# Patient Record
Sex: Male | Born: 1975 | Race: White | Hispanic: No | Marital: Married | Smoking: Never smoker
Health system: Southern US, Community
[De-identification: ages and names within clinical notes are randomized; demographics above are authoritative.]

## PROBLEM LIST (undated history)

## (undated) DIAGNOSIS — I10 Essential (primary) hypertension: Secondary | ICD-10-CM

---

## 2018-05-20 ENCOUNTER — Other Ambulatory Visit: Payer: Self-pay

## 2018-05-20 ENCOUNTER — Emergency Department (HOSPITAL_BASED_OUTPATIENT_CLINIC_OR_DEPARTMENT_OTHER): Payer: Self-pay

## 2018-05-20 ENCOUNTER — Inpatient Hospital Stay (HOSPITAL_BASED_OUTPATIENT_CLINIC_OR_DEPARTMENT_OTHER)
Admission: EM | Admit: 2018-05-20 | Discharge: 2018-05-23 | DRG: 917 | Disposition: A | Discharge status: Home / Self Care | Payer: Self-pay | Attending: Internal Medicine | Admitting: Internal Medicine

## 2018-05-20 ENCOUNTER — Encounter (HOSPITAL_BASED_OUTPATIENT_CLINIC_OR_DEPARTMENT_OTHER): Payer: Self-pay | Admitting: Emergency Medicine

## 2018-05-20 DIAGNOSIS — J705 Respiratory conditions due to smoke inhalation: Secondary | ICD-10-CM | POA: Diagnosis present

## 2018-05-20 DIAGNOSIS — R0603 Acute respiratory distress: Secondary | ICD-10-CM

## 2018-05-20 DIAGNOSIS — F419 Anxiety disorder, unspecified: Secondary | ICD-10-CM | POA: Diagnosis present

## 2018-05-20 DIAGNOSIS — Y9289 Other specified places as the place of occurrence of the external cause: Secondary | ICD-10-CM

## 2018-05-20 DIAGNOSIS — Z91018 Allergy to other foods: Secondary | ICD-10-CM

## 2018-05-20 DIAGNOSIS — R7309 Other abnormal glucose: Secondary | ICD-10-CM | POA: Diagnosis present

## 2018-05-20 DIAGNOSIS — T59811A Toxic effect of smoke, accidental (unintentional), initial encounter: Principal | ICD-10-CM | POA: Diagnosis present

## 2018-05-20 DIAGNOSIS — D72829 Elevated white blood cell count, unspecified: Secondary | ICD-10-CM | POA: Diagnosis present

## 2018-05-20 DIAGNOSIS — J9601 Acute respiratory failure with hypoxia: Secondary | ICD-10-CM | POA: Diagnosis present

## 2018-05-20 DIAGNOSIS — Z91010 Allergy to peanuts: Secondary | ICD-10-CM

## 2018-05-20 DIAGNOSIS — J40 Bronchitis, not specified as acute or chronic: Secondary | ICD-10-CM

## 2018-05-20 DIAGNOSIS — N179 Acute kidney failure, unspecified: Secondary | ICD-10-CM | POA: Diagnosis present

## 2018-05-20 DIAGNOSIS — T508X5A Adverse effect of diagnostic agents, initial encounter: Secondary | ICD-10-CM | POA: Diagnosis not present

## 2018-05-20 DIAGNOSIS — I10 Essential (primary) hypertension: Secondary | ICD-10-CM | POA: Diagnosis present

## 2018-05-20 DIAGNOSIS — Z9114 Patient's other noncompliance with medication regimen: Secondary | ICD-10-CM

## 2018-05-20 DIAGNOSIS — Y92230 Patient room in hospital as the place of occurrence of the external cause: Secondary | ICD-10-CM | POA: Diagnosis not present

## 2018-05-20 DIAGNOSIS — Z880 Allergy status to penicillin: Secondary | ICD-10-CM

## 2018-05-20 DIAGNOSIS — F17220 Nicotine dependence, chewing tobacco, uncomplicated: Secondary | ICD-10-CM | POA: Diagnosis present

## 2018-05-20 DIAGNOSIS — Z8249 Family history of ischemic heart disease and other diseases of the circulatory system: Secondary | ICD-10-CM

## 2018-05-20 DIAGNOSIS — Z833 Family history of diabetes mellitus: Secondary | ICD-10-CM

## 2018-05-20 DIAGNOSIS — I4892 Unspecified atrial flutter: Secondary | ICD-10-CM | POA: Diagnosis present

## 2018-05-20 DIAGNOSIS — J209 Acute bronchitis, unspecified: Secondary | ICD-10-CM | POA: Diagnosis present

## 2018-05-20 DIAGNOSIS — K0889 Other specified disorders of teeth and supporting structures: Secondary | ICD-10-CM | POA: Diagnosis present

## 2018-05-20 DIAGNOSIS — I471 Supraventricular tachycardia: Secondary | ICD-10-CM | POA: Diagnosis present

## 2018-05-20 HISTORY — DX: Essential (primary) hypertension: I10

## 2018-05-20 LAB — CBC WITH DIFFERENTIAL/PLATELET
Abs Immature Granulocytes: 0.04 10*3/uL (ref 0.00–0.07)
Basophils Absolute: 0.1 10*3/uL (ref 0.0–0.1)
Basophils Relative: 1 %
Eosinophils Absolute: 0.1 10*3/uL (ref 0.0–0.5)
Eosinophils Relative: 0 %
HCT: 52.4 % — ABNORMAL HIGH (ref 39.0–52.0)
Hemoglobin: 17.5 g/dL — ABNORMAL HIGH (ref 13.0–17.0)
Immature Granulocytes: 0 %
Lymphocytes Relative: 4 %
Lymphs Abs: 0.6 10*3/uL — ABNORMAL LOW (ref 0.7–4.0)
MCH: 28.3 pg (ref 26.0–34.0)
MCHC: 33.4 g/dL (ref 30.0–36.0)
MCV: 84.7 fL (ref 80.0–100.0)
Monocytes Absolute: 0.8 10*3/uL (ref 0.1–1.0)
Monocytes Relative: 5 %
Neutro Abs: 12.6 10*3/uL — ABNORMAL HIGH (ref 1.7–7.7)
Neutrophils Relative %: 90 %
Platelets: 302 10*3/uL (ref 150–400)
RBC: 6.19 MIL/uL — ABNORMAL HIGH (ref 4.22–5.81)
RDW: 12.3 % (ref 11.5–15.5)
WBC: 14.1 10*3/uL — ABNORMAL HIGH (ref 4.0–10.5)
nRBC: 0 % (ref 0.0–0.2)

## 2018-05-20 LAB — CBC
HCT: 51.1 % (ref 39.0–52.0)
Hemoglobin: 17.1 g/dL — ABNORMAL HIGH (ref 13.0–17.0)
MCH: 28.4 pg (ref 26.0–34.0)
MCHC: 33.5 g/dL (ref 30.0–36.0)
MCV: 84.9 fL (ref 80.0–100.0)
Platelets: 323 10*3/uL (ref 150–400)
RBC: 6.02 MIL/uL — ABNORMAL HIGH (ref 4.22–5.81)
RDW: 12.5 % (ref 11.5–15.5)
WBC: 14 10*3/uL — AB (ref 4.0–10.5)
nRBC: 0 % (ref 0.0–0.2)

## 2018-05-20 LAB — COMPREHENSIVE METABOLIC PANEL
ALT: 19 U/L (ref 0–44)
AST: 28 U/L (ref 15–41)
Albumin: 4.7 g/dL (ref 3.5–5.0)
Alkaline Phosphatase: 68 U/L (ref 38–126)
Anion gap: 12 (ref 5–15)
BUN: 6 mg/dL (ref 6–20)
CO2: 22 mmol/L (ref 22–32)
Calcium: 9.6 mg/dL (ref 8.9–10.3)
Chloride: 105 mmol/L (ref 98–111)
Creatinine, Ser: 0.89 mg/dL (ref 0.61–1.24)
GFR calc Af Amer: >60 mL/min (ref >60)
GFR calc non Af Amer: >60 mL/min (ref >60)
Glucose, Bld: 151 mg/dL — ABNORMAL HIGH (ref 70–99)
Potassium: 3.9 mmol/L (ref 3.5–5.1)
Sodium: 139 mmol/L (ref 135–145)
Total Bilirubin: 1.3 mg/dL — ABNORMAL HIGH (ref 0.3–1.2)
Total Protein: 8.7 g/dL — ABNORMAL HIGH (ref 6.5–8.1)

## 2018-05-20 LAB — BASIC METABOLIC PANEL
Anion gap: 12 (ref 5–15)
BUN: <5 mg/dL — ABNORMAL LOW (ref 6–20)
CO2: 23 mmol/L (ref 22–32)
Calcium: 9.5 mg/dL (ref 8.9–10.3)
Chloride: 104 mmol/L (ref 98–111)
Creatinine, Ser: 0.86 mg/dL (ref 0.61–1.24)
GFR calc Af Amer: >60 mL/min (ref >60)
GFR calc non Af Amer: >60 mL/min (ref >60)
Glucose, Bld: 131 mg/dL — ABNORMAL HIGH (ref 70–99)
Potassium: 3.3 mmol/L — ABNORMAL LOW (ref 3.5–5.1)
Sodium: 139 mmol/L (ref 135–145)

## 2018-05-20 LAB — RAPID URINE DRUG SCREEN, HOSP PERFORMED
Amphetamines: NOT DETECTED
BENZODIAZEPINES: NOT DETECTED
Barbiturates: NOT DETECTED
Cocaine: NOT DETECTED
Opiates: NOT DETECTED
Tetrahydrocannabinol: NOT DETECTED

## 2018-05-20 LAB — URINALYSIS, ROUTINE W REFLEX MICROSCOPIC
Bilirubin Urine: NEGATIVE
Glucose, UA: NEGATIVE mg/dL
Hgb urine dipstick: NEGATIVE
Ketones, ur: NEGATIVE mg/dL
Leukocytes, UA: NEGATIVE
Nitrite: NEGATIVE
Protein, ur: NEGATIVE mg/dL
Specific Gravity, Urine: 1.02 (ref 1.005–1.030)
pH: 8 (ref 5.0–8.0)

## 2018-05-20 LAB — TSH: TSH: 1.287 u[IU]/mL (ref 0.350–4.500)

## 2018-05-20 LAB — INFLUENZA PANEL BY PCR (TYPE A & B)
Influenza A By PCR: NEGATIVE
Influenza B By PCR: NEGATIVE

## 2018-05-20 LAB — BRAIN NATRIURETIC PEPTIDE: B Natriuretic Peptide: 30 pg/mL (ref 0.0–100.0)

## 2018-05-20 LAB — CK: Total CK: 179 U/L (ref 49–397)

## 2018-05-20 LAB — TROPONIN I: Troponin I: <0.03 ng/mL (ref <0.03)

## 2018-05-20 LAB — MAGNESIUM: Magnesium: 1.7 mg/dL (ref 1.7–2.4)

## 2018-05-20 LAB — D-DIMER, QUANTITATIVE: D-Dimer, Quant: 0.71 ug/mL-FEU — ABNORMAL HIGH (ref 0.00–0.50)

## 2018-05-20 MED ORDER — PREDNISONE 20 MG PO TABS
40.0000 mg | ORAL_TABLET | Freq: Every day | ORAL | Status: DC
  Administered 2018-05-21 – 2018-05-23 (×3): 40 mg via ORAL
  Filled 2018-05-20 (×3): qty 2

## 2018-05-20 MED ORDER — ACETAMINOPHEN 650 MG RE SUPP: 650.0000 mg | Freq: Four times a day (QID) | RECTAL | Status: DC | PRN

## 2018-05-20 MED ORDER — DILTIAZEM HCL 25 MG/5ML IV SOLN
20.0000 mg | Freq: Once | INTRAVENOUS | Status: AC
  Administered 2018-05-20: 20 mg via INTRAVENOUS
  Filled 2018-05-20: qty 5

## 2018-05-20 MED ORDER — LEVALBUTEROL HCL 1.25 MG/0.5ML IN NEBU
INHALATION_SOLUTION | RESPIRATORY_TRACT | Status: AC
  Filled 2018-05-20: qty 0.5

## 2018-05-20 MED ORDER — POTASSIUM CHLORIDE CRYS ER 20 MEQ PO TBCR
40.0000 meq | EXTENDED_RELEASE_TABLET | Freq: Once | ORAL | Status: DC
  Filled 2018-05-20: qty 2

## 2018-05-20 MED ORDER — LEVALBUTEROL HCL 0.63 MG/3ML IN NEBU: 0.6300 mg | INHALATION_SOLUTION | Freq: Four times a day (QID) | RESPIRATORY_TRACT | Status: DC | PRN

## 2018-05-20 MED ORDER — ACETAMINOPHEN 325 MG PO TABS: 650.0000 mg | ORAL_TABLET | Freq: Four times a day (QID) | ORAL | Status: DC | PRN

## 2018-05-20 MED ORDER — SODIUM CHLORIDE 0.9 % IV BOLUS
1000.0000 mL | Freq: Once | INTRAVENOUS | Status: AC
  Administered 2018-05-20: 1000 mL via INTRAVENOUS

## 2018-05-20 MED ORDER — FENTANYL CITRATE (PF) 100 MCG/2ML IJ SOLN
75.0000 ug | Freq: Once | INTRAMUSCULAR | Status: AC
  Administered 2018-05-20: 75 ug via INTRAVENOUS
  Filled 2018-05-20: qty 2

## 2018-05-20 MED ORDER — HYDROCODONE-ACETAMINOPHEN 5-325 MG PO TABS
1.0000 | ORAL_TABLET | ORAL | Status: DC | PRN
  Administered 2018-05-21: 1 via ORAL
  Administered 2018-05-21: 2 via ORAL
  Filled 2018-05-20: qty 2
  Filled 2018-05-20: qty 1

## 2018-05-20 MED ORDER — SODIUM CHLORIDE 0.9 % IV SOLN
250.0000 mL | INTRAVENOUS | Status: DC
  Administered 2018-05-20: 250 mL via INTRAVENOUS

## 2018-05-20 MED ORDER — ONDANSETRON HCL 4 MG PO TABS: 4.0000 mg | ORAL_TABLET | Freq: Four times a day (QID) | ORAL | Status: DC | PRN

## 2018-05-20 MED ORDER — LEVALBUTEROL HCL 1.25 MG/0.5ML IN NEBU
1.2500 mg | INHALATION_SOLUTION | Freq: Three times a day (TID) | RESPIRATORY_TRACT | Status: DC
  Administered 2018-05-20 – 2018-05-22 (×5): 1.25 mg via RESPIRATORY_TRACT
  Filled 2018-05-20 (×6): qty 0.5

## 2018-05-20 MED ORDER — SODIUM CHLORIDE 0.9% FLUSH
3.0000 mL | INTRAVENOUS | Status: DC | PRN
  Filled 2018-05-20: qty 3

## 2018-05-20 MED ORDER — LISINOPRIL 10 MG PO TABS
10.0000 mg | ORAL_TABLET | Freq: Every day | ORAL | Status: DC
  Administered 2018-05-20 – 2018-05-21 (×2): 10 mg via ORAL
  Filled 2018-05-20 (×2): qty 1

## 2018-05-20 MED ORDER — LEVALBUTEROL HCL 1.25 MG/0.5ML IN NEBU
INHALATION_SOLUTION | RESPIRATORY_TRACT | Status: AC
  Administered 2018-05-20: 1.25 mg
  Filled 2018-05-20: qty 0.5

## 2018-05-20 MED ORDER — IOPAMIDOL (ISOVUE-370) INJECTION 76%
100.0000 mL | Freq: Once | INTRAVENOUS | Status: AC | PRN
  Administered 2018-05-20: 71 mL via INTRAVENOUS

## 2018-05-20 MED ORDER — HYDRALAZINE HCL 20 MG/ML IJ SOLN
10.0000 mg | INTRAMUSCULAR | Status: DC | PRN
  Filled 2018-05-20: qty 1

## 2018-05-20 MED ORDER — IPRATROPIUM-ALBUTEROL 0.5-2.5 (3) MG/3ML IN SOLN: 3.0000 mL | Freq: Four times a day (QID) | RESPIRATORY_TRACT | Status: DC

## 2018-05-20 MED ORDER — LABETALOL HCL 5 MG/ML IV SOLN
10.0000 mg | INTRAVENOUS | Status: DC | PRN
  Filled 2018-05-20 (×2): qty 4

## 2018-05-20 MED ORDER — IPRATROPIUM BROMIDE 0.02 % IN SOLN
0.5000 mg | Freq: Three times a day (TID) | RESPIRATORY_TRACT | Status: DC
  Administered 2018-05-20 – 2018-05-22 (×5): 0.5 mg via RESPIRATORY_TRACT
  Filled 2018-05-20 (×6): qty 2.5

## 2018-05-20 MED ORDER — PROPOFOL 10 MG/ML IV BOLUS
INTRAVENOUS | Status: AC | PRN
  Administered 2018-05-20: 50 mg via INTRAVENOUS
  Administered 2018-05-20: 30 mg via INTRAVENOUS

## 2018-05-20 MED ORDER — ONDANSETRON HCL 4 MG/2ML IJ SOLN: 4.0000 mg | Freq: Four times a day (QID) | INTRAMUSCULAR | Status: DC | PRN

## 2018-05-20 MED ORDER — POTASSIUM CHLORIDE 10 MEQ/100ML IV SOLN
10.0000 meq | Freq: Once | INTRAVENOUS | Status: AC
  Administered 2018-05-20: 10 meq via INTRAVENOUS
  Filled 2018-05-20: qty 100

## 2018-05-20 MED ORDER — LEVALBUTEROL HCL 1.25 MG/0.5ML IN NEBU
1.2500 mg | INHALATION_SOLUTION | Freq: Once | RESPIRATORY_TRACT | Status: AC
  Administered 2018-05-20: 1.25 mg via RESPIRATORY_TRACT
  Filled 2018-05-20: qty 0.5

## 2018-05-20 MED ORDER — ENOXAPARIN SODIUM 40 MG/0.4ML ~~LOC~~ SOLN
40.0000 mg | SUBCUTANEOUS | Status: DC
  Administered 2018-05-20 – 2018-05-21 (×2): 40 mg via SUBCUTANEOUS
  Filled 2018-05-20 (×2): qty 0.4

## 2018-05-20 MED ORDER — LEVALBUTEROL HCL 1.25 MG/0.5ML IN NEBU
1.2500 mg | INHALATION_SOLUTION | Freq: Once | RESPIRATORY_TRACT | Status: AC
  Administered 2018-05-20: 1.25 mg via RESPIRATORY_TRACT

## 2018-05-20 MED ORDER — ORAL CARE MOUTH RINSE
15.0000 mL | Freq: Two times a day (BID) | OROMUCOSAL | Status: DC
  Administered 2018-05-22: 15 mL via OROMUCOSAL

## 2018-05-20 MED ORDER — SODIUM CHLORIDE 0.9% FLUSH
3.0000 mL | Freq: Two times a day (BID) | INTRAVENOUS | Status: DC
  Administered 2018-05-20 – 2018-05-21 (×3): 3 mL via INTRAVENOUS
  Filled 2018-05-20: qty 3

## 2018-05-20 MED ORDER — IPRATROPIUM BROMIDE 0.02 % IN SOLN
RESPIRATORY_TRACT | Status: AC
  Filled 2018-05-20: qty 2.5

## 2018-05-20 MED ORDER — ALPRAZOLAM 0.25 MG PO TABS
0.2500 mg | ORAL_TABLET | Freq: Once | ORAL | Status: AC
  Administered 2018-05-20: 0.5 mg via ORAL
  Filled 2018-05-20: qty 2

## 2018-05-20 MED ORDER — PROPOFOL 10 MG/ML IV BOLUS
50.0000 mg | Freq: Once | INTRAVENOUS | Status: AC
  Administered 2018-05-20: 50 mg via INTRAVENOUS
  Filled 2018-05-20: qty 20

## 2018-05-20 MED ORDER — IPRATROPIUM BROMIDE 0.02 % IN SOLN
RESPIRATORY_TRACT | Status: AC
  Administered 2018-05-20: 0.5 mg
  Filled 2018-05-20: qty 2.5

## 2018-05-20 MED ORDER — LORAZEPAM 2 MG/ML IJ SOLN
1.0000 mg | Freq: Once | INTRAMUSCULAR | Status: AC
  Administered 2018-05-20: 1 mg via INTRAVENOUS
  Filled 2018-05-20: qty 1

## 2018-05-20 MED ORDER — METHYLPREDNISOLONE SODIUM SUCC 125 MG IJ SOLR
125.0000 mg | Freq: Once | INTRAMUSCULAR | Status: AC
  Administered 2018-05-20: 125 mg via INTRAVENOUS
  Filled 2018-05-20: qty 2

## 2018-05-20 MED ORDER — SODIUM CHLORIDE 0.9 % IV SOLN
INTRAVENOUS | Status: AC
  Administered 2018-05-20: 20:00:00 via INTRAVENOUS
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
## 2018-05-20 NOTE — ED Notes (Signed)
ED Provider at bedside. 

## 2018-05-20 NOTE — ED Notes (Signed)
Report to Thea Silversmith, Charity fundraiser at [employer]
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
## 2018-05-20 NOTE — ED Notes (Addendum)
Cardioversion by Dr. Juleen China
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
## 2018-05-20 NOTE — ED Notes (Signed)
Patient taken off BiPAP at this time per him request and placed on [facility]. Seems to be doing a bit better. RR 22. SAT 98%. MD aware

## 2018-05-20 NOTE — ED Notes (Signed)
Spoke to patient about BiPAP. He stated he did not feel as if he needed it at this time. Stated he feels better after coughing up secretions. Will continue to monitor.

## 2018-05-20 NOTE — ED Notes (Signed)
O2 increased to 6L by RT

## 2018-05-21 ENCOUNTER — Inpatient Hospital Stay (HOSPITAL_COMMUNITY): Payer: Self-pay

## 2018-05-21 DIAGNOSIS — J209 Acute bronchitis, unspecified: Secondary | ICD-10-CM

## 2018-05-21 DIAGNOSIS — J9601 Acute respiratory failure with hypoxia: Secondary | ICD-10-CM

## 2018-05-21 DIAGNOSIS — D72829 Elevated white blood cell count, unspecified: Secondary | ICD-10-CM

## 2018-05-21 DIAGNOSIS — I361 Nonrheumatic tricuspid (valve) insufficiency: Secondary | ICD-10-CM

## 2018-05-21 DIAGNOSIS — I370 Nonrheumatic pulmonary valve stenosis: Secondary | ICD-10-CM

## 2018-05-21 LAB — BLOOD GAS, ARTERIAL
Acid-base deficit: 0.6 mmol/L (ref 0.0–2.0)
Bicarbonate: 22.5 mmol/L (ref 20.0–28.0)
Drawn by: 232811
O2 Saturation: 91.8 %
Patient temperature: 99
pCO2 arterial: 35.1 mmHg (ref 32.0–48.0)
pH, Arterial: 7.424 (ref 7.350–7.450)
pO2, Arterial: 61.1 mmHg — ABNORMAL LOW (ref 83.0–108.0)

## 2018-05-21 LAB — CBC
HCT: 49.4 % (ref 39.0–52.0)
Hemoglobin: 16.4 g/dL (ref 13.0–17.0)
MCH: 28.3 pg (ref 26.0–34.0)
MCHC: 33.2 g/dL (ref 30.0–36.0)
MCV: 85.3 fL (ref 80.0–100.0)
NRBC: 0 % (ref 0.0–0.2)
Platelets: 301 10*3/uL (ref 150–400)
RBC: 5.79 MIL/uL (ref 4.22–5.81)
RDW: 12.6 % (ref 11.5–15.5)
WBC: 10.5 10*3/uL (ref 4.0–10.5)

## 2018-05-21 LAB — ECHOCARDIOGRAM COMPLETE
HEIGHTINCHES: 68 in
Weight: 2880 oz

## 2018-05-21 LAB — BASIC METABOLIC PANEL
Anion gap: 9 (ref 5–15)
BUN: 10 mg/dL (ref 6–20)
CALCIUM: 9.3 mg/dL (ref 8.9–10.3)
CO2: 23 mmol/L (ref 22–32)
Chloride: 105 mmol/L (ref 98–111)
Creatinine, Ser: 0.89 mg/dL (ref 0.61–1.24)
GFR calc Af Amer: >60 mL/min (ref >60)
GFR calc non Af Amer: >60 mL/min (ref >60)
Glucose, Bld: 164 mg/dL — ABNORMAL HIGH (ref 70–99)
Potassium: 4.1 mmol/L (ref 3.5–5.1)
Sodium: 137 mmol/L (ref 135–145)

## 2018-05-21 LAB — MAGNESIUM: Magnesium: 1.9 mg/dL (ref 1.7–2.4)

## 2018-05-21 LAB — PHOSPHORUS: Phosphorus: 2.4 mg/dL — ABNORMAL LOW (ref 2.5–4.6)

## 2018-05-21 LAB — TROPONIN I
Troponin I: <0.03 ng/mL (ref <0.03)
Troponin I: <0.03 ng/mL (ref <0.03)

## 2018-05-21 LAB — HIV ANTIBODY (ROUTINE TESTING W REFLEX): HIV Screen 4th Generation wRfx: NONREACTIVE

## 2018-05-21 LAB — CARBOXYHEMOGLOBIN - COOX: Carboxyhemoglobin: 1 % (ref 0.5–1.5)

## 2018-05-21 MED ORDER — GUAIFENESIN 100 MG/5ML PO SOLN
10.0000 mL | Freq: Once | ORAL | Status: AC
  Administered 2018-05-21: 200 mg via ORAL
  Filled 2018-05-21: qty 10

## 2018-05-21 MED ORDER — AZITHROMYCIN 250 MG PO TABS
500.0000 mg | ORAL_TABLET | Freq: Every day | ORAL | Status: AC
  Administered 2018-05-21: 500 mg via ORAL
  Filled 2018-05-21: qty 2

## 2018-05-21 MED ORDER — AZITHROMYCIN 250 MG PO TABS
250.0000 mg | ORAL_TABLET | Freq: Every day | ORAL | Status: DC
  Administered 2018-05-22 – 2018-05-23 (×2): 250 mg via ORAL
  Filled 2018-05-21 (×2): qty 1
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
## 2018-05-21 NOTE — Progress Notes (Signed)
Pt transferred to room 1415, resting comfortably in chair. Vital signs are at pt's baseline (HR elevated in 120's). Telemetry applied. Agree with previous RN assessment. Auset Fritzler, Lavone Orn, RN

## 2018-05-21 NOTE — Progress Notes (Signed)
  Echocardiogram 2D Echocardiogram has been performed.  Leta Jungling M 05/21/2018, 9:09 AM
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
## 2018-05-22 DIAGNOSIS — N179 Acute kidney failure, unspecified: Secondary | ICD-10-CM

## 2018-05-22 LAB — BASIC METABOLIC PANEL
Anion gap: 12 (ref 5–15)
Anion gap: 13 (ref 5–15)
Anion gap: 10 (ref 5–15)
BUN: 35 mg/dL — ABNORMAL HIGH (ref 6–20)
BUN: 38 mg/dL — ABNORMAL HIGH (ref 6–20)
BUN: 35 mg/dL — ABNORMAL HIGH (ref 6–20)
CO2: 22 mmol/L (ref 22–32)
CO2: 24 mmol/L (ref 22–32)
CO2: 22 mmol/L (ref 22–32)
CREATININE: 3.15 mg/dL — AB (ref 0.61–1.24)
CREATININE: 2.15 mg/dL — AB (ref 0.61–1.24)
Calcium: 9.1 mg/dL (ref 8.9–10.3)
Calcium: 9 mg/dL (ref 8.9–10.3)
Calcium: 8.8 mg/dL — ABNORMAL LOW (ref 8.9–10.3)
Chloride: 103 mmol/L (ref 98–111)
Chloride: 99 mmol/L (ref 98–111)
Chloride: 105 mmol/L (ref 98–111)
Creatinine, Ser: 3.19 mg/dL — ABNORMAL HIGH (ref 0.61–1.24)
GFR calc Af Amer: 27 mL/min — ABNORMAL LOW (ref >60)
GFR calc Af Amer: 43 mL/min — ABNORMAL LOW (ref >60)
GFR calc non Af Amer: 23 mL/min — ABNORMAL LOW (ref >60)
GFR calc non Af Amer: 23 mL/min — ABNORMAL LOW (ref >60)
GFR calc non Af Amer: 37 mL/min — ABNORMAL LOW (ref >60)
GFR, EST AFRICAN AMERICAN: 27 mL/min — AB (ref >60)
Glucose, Bld: 99 mg/dL (ref 70–99)
Glucose, Bld: 96 mg/dL (ref 70–99)
Glucose, Bld: 128 mg/dL — ABNORMAL HIGH (ref 70–99)
Potassium: 3.5 mmol/L (ref 3.5–5.1)
Potassium: 3.2 mmol/L — ABNORMAL LOW (ref 3.5–5.1)
Potassium: 3.7 mmol/L (ref 3.5–5.1)
Sodium: 137 mmol/L (ref 135–145)
Sodium: 136 mmol/L (ref 135–145)
Sodium: 137 mmol/L (ref 135–145)

## 2018-05-22 LAB — CBC
HCT: 46.7 % (ref 39.0–52.0)
HEMOGLOBIN: 15.2 g/dL (ref 13.0–17.0)
MCH: 29.1 pg (ref 26.0–34.0)
MCHC: 32.5 g/dL (ref 30.0–36.0)
MCV: 89.5 fL (ref 80.0–100.0)
Platelets: 246 10*3/uL (ref 150–400)
RBC: 5.22 MIL/uL (ref 4.22–5.81)
RDW: 13.1 % (ref 11.5–15.5)
WBC: 13.4 10*3/uL — ABNORMAL HIGH (ref 4.0–10.5)
nRBC: 0 % (ref 0.0–0.2)

## 2018-05-22 LAB — URINALYSIS, ROUTINE W REFLEX MICROSCOPIC
BILIRUBIN URINE: NEGATIVE
Glucose, UA: NEGATIVE mg/dL
Hgb urine dipstick: NEGATIVE
Ketones, ur: NEGATIVE mg/dL
Leukocytes, UA: NEGATIVE
Nitrite: NEGATIVE
PH: 5 (ref 5.0–8.0)
Protein, ur: NEGATIVE mg/dL
Specific Gravity, Urine: 1.015 (ref 1.005–1.030)

## 2018-05-22 MED ORDER — ENOXAPARIN SODIUM 30 MG/0.3ML ~~LOC~~ SOLN
30.0000 mg | SUBCUTANEOUS | Status: DC
  Administered 2018-05-22: 30 mg via SUBCUTANEOUS
  Filled 2018-05-22: qty 0.3

## 2018-05-22 MED ORDER — SODIUM CHLORIDE 0.45 % IV SOLN
INTRAVENOUS | Status: DC
  Administered 2018-05-22 (×2): via INTRAVENOUS

## 2018-05-22 MED ORDER — POTASSIUM CHLORIDE CRYS ER 20 MEQ PO TBCR
20.0000 meq | EXTENDED_RELEASE_TABLET | Freq: Once | ORAL | Status: AC
  Administered 2018-05-22: 20 meq via ORAL
  Filled 2018-05-22: qty 1

## 2018-05-22 MED ORDER — SODIUM CHLORIDE 0.45 % IV BOLUS
500.0000 mL | Freq: Once | INTRAVENOUS | Status: AC
  Administered 2018-05-22: 500 mL via INTRAVENOUS

## 2018-05-22 MED ORDER — HYDROCODONE-ACETAMINOPHEN 5-325 MG PO TABS
1.0000 | ORAL_TABLET | ORAL | Status: DC | PRN
  Administered 2018-05-22 (×2): 1 via ORAL
  Filled 2018-05-22 (×2): qty 1
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
## 2018-05-23 LAB — BASIC METABOLIC PANEL
Anion gap: 9 (ref 5–15)
BUN: 24 mg/dL — ABNORMAL HIGH (ref 6–20)
CHLORIDE: 106 mmol/L (ref 98–111)
CO2: 21 mmol/L — ABNORMAL LOW (ref 22–32)
Calcium: 8.5 mg/dL — ABNORMAL LOW (ref 8.9–10.3)
Creatinine, Ser: 1.2 mg/dL (ref 0.61–1.24)
GFR calc Af Amer: >60 mL/min (ref >60)
GFR calc non Af Amer: >60 mL/min (ref >60)
Glucose, Bld: 95 mg/dL (ref 70–99)
POTASSIUM: 3.3 mmol/L — AB (ref 3.5–5.1)
SODIUM: 136 mmol/L (ref 135–145)

## 2018-05-23 LAB — CBC
HEMATOCRIT: 43.1 % (ref 39.0–52.0)
Hemoglobin: 13.9 g/dL (ref 13.0–17.0)
MCH: 29.1 pg (ref 26.0–34.0)
MCHC: 32.3 g/dL (ref 30.0–36.0)
MCV: 90.2 fL (ref 80.0–100.0)
Platelets: 211 10*3/uL (ref 150–400)
RBC: 4.78 MIL/uL (ref 4.22–5.81)
RDW: 12.7 % (ref 11.5–15.5)
WBC: 8.4 10*3/uL (ref 4.0–10.5)
nRBC: 0 % (ref 0.0–0.2)

## 2018-05-23 MED ORDER — POTASSIUM CHLORIDE CRYS ER 20 MEQ PO TBCR
40.0000 meq | EXTENDED_RELEASE_TABLET | Freq: Once | ORAL | Status: AC
  Administered 2018-05-23: 40 meq via ORAL
  Filled 2018-05-23: qty 2

## 2018-05-23 MED ORDER — ALBUTEROL SULFATE HFA 108 (90 BASE) MCG/ACT IN AERS: 2.0000 | INHALATION_SPRAY | Freq: Four times a day (QID) | RESPIRATORY_TRACT | 2 refills | Status: AC | PRN

## 2018-05-23 MED ORDER — ENOXAPARIN SODIUM 40 MG/0.4ML ~~LOC~~ SOLN: 40.0000 mg | SUBCUTANEOUS | Status: DC

## 2018-05-23 MED ORDER — AZITHROMYCIN 250 MG PO TABS: 250.0000 mg | ORAL_TABLET | Freq: Every day | ORAL | 0 refills | Status: AC

## 2018-05-23 MED ORDER — PREDNISONE 10 MG PO TABS: ORAL_TABLET | ORAL | 0 refills | Status: AC

## 2018-05-23 MED ORDER — AMLODIPINE BESYLATE 5 MG PO TABS: 5.0000 mg | ORAL_TABLET | Freq: Every day | ORAL | 0 refills | Status: AC
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
## 2019-10-20 IMAGING — CT CT ANGIO CHEST
2 of 8 series · 19 of 36 positions shown · IV contrast (iopamidol)
Comparison: Plain film from earlier in the same day.

CLINICAL DATA: Shortness of breath and tachycardia

EXAM:
CT ANGIOGRAPHY CHEST WITH CONTRAST
TECHNIQUE: Multidetector CT imaging of the chest was performed using the
standard protocol during bolus administration of intravenous
contrast. Multiplanar CT image reconstructions and MIPs were
obtained to evaluate the vascular anatomy.
CONTRAST:  71mL [id number] IOPAMIDOL ([id number]) INJECTION 76%

[Series 9: pe thins · axial · 0.88mm/px · z∈[+721,+1033]mm · 18 of 350 slices shown]
[im 19/350  lung]
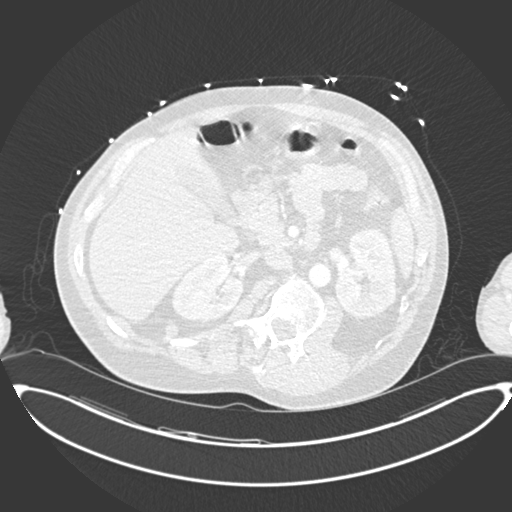
[im 37/350  mediastinal]
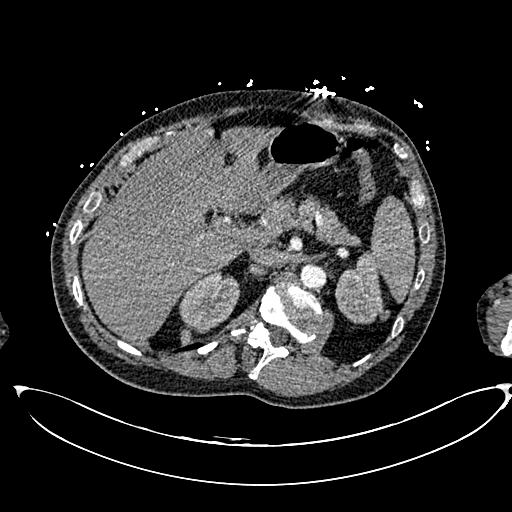
[im 56/350  lung]
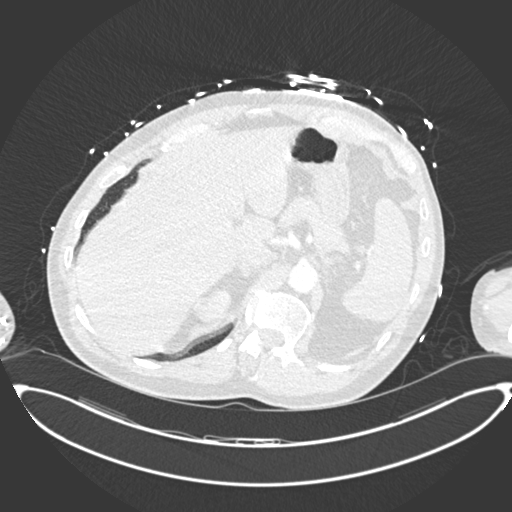
[im 74/350  mediastinal]
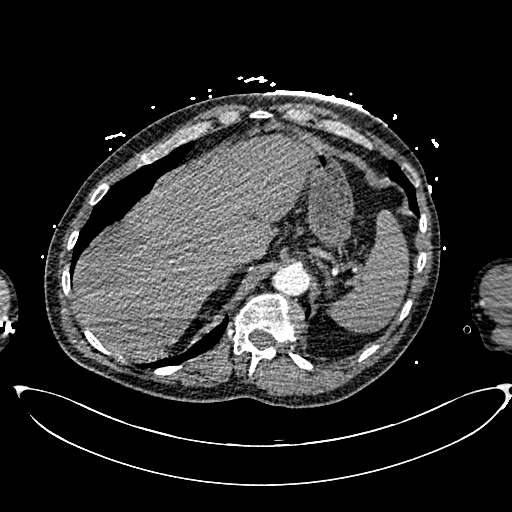
[im 92/350  lung]
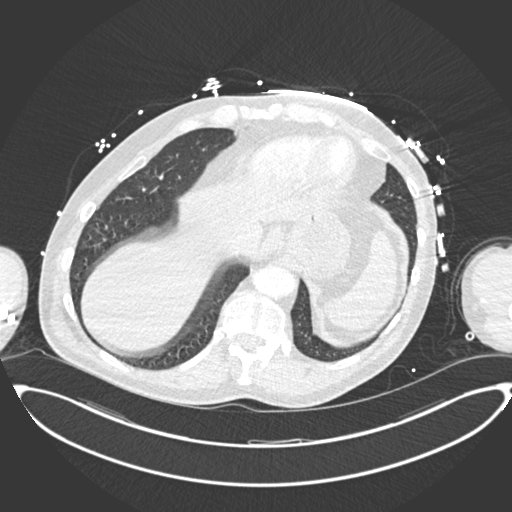
[im 111/350  mediastinal]
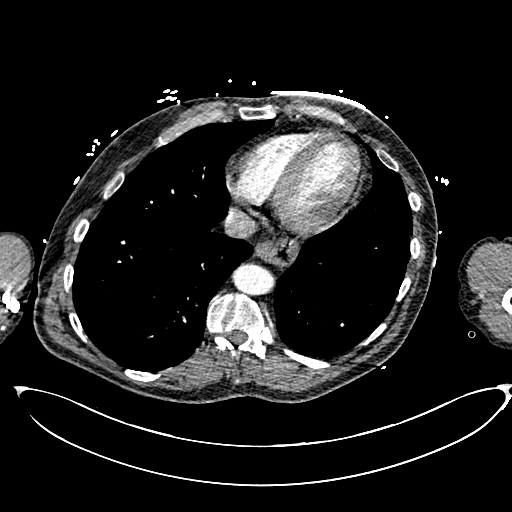
[im 129/350  lung]
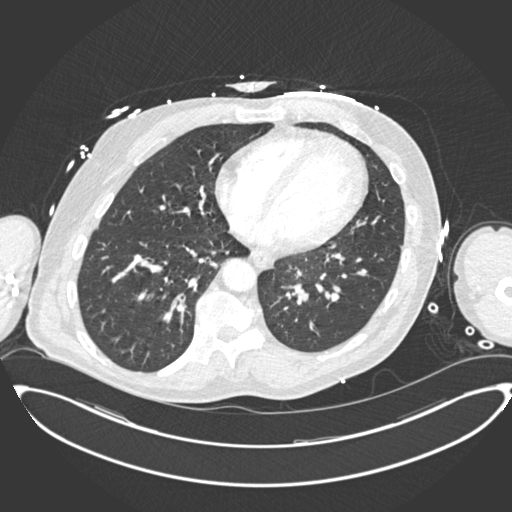
[im 147/350  mediastinal]
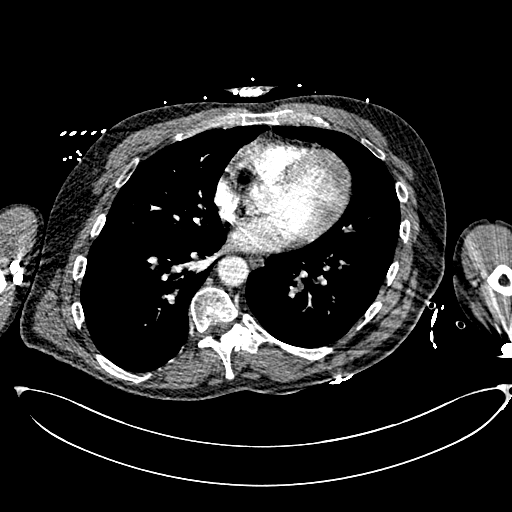
[im 166/350  lung]
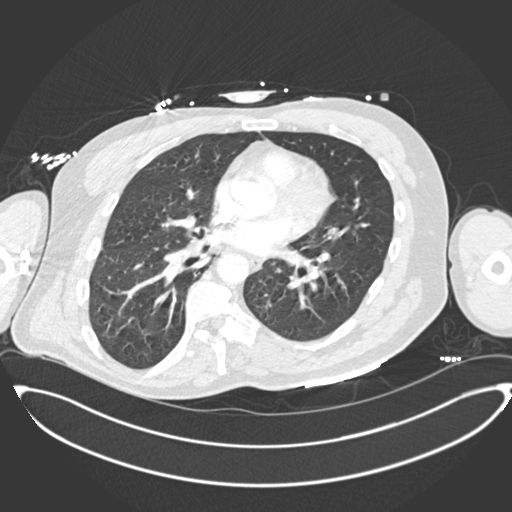
[im 184/350  mediastinal]
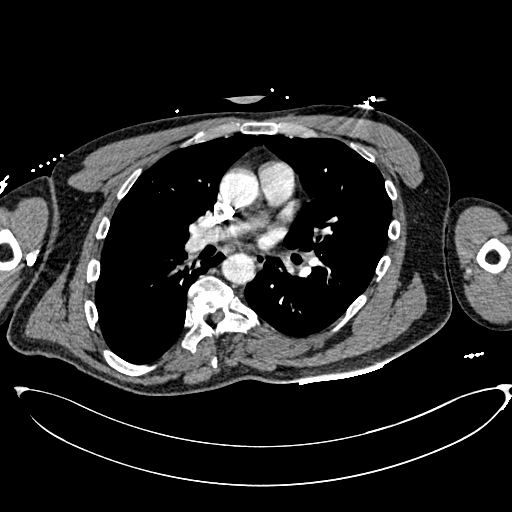
[im 203/350  lung]
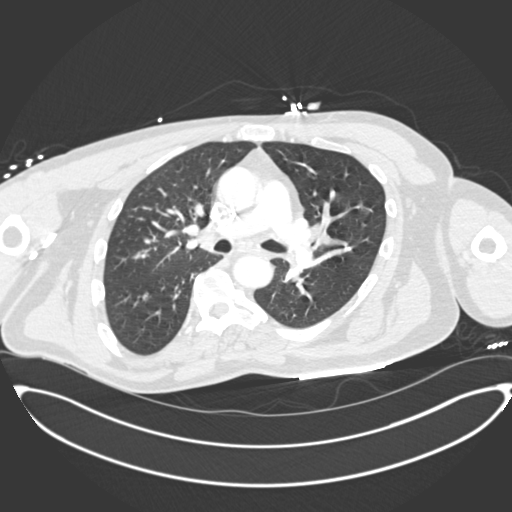
[im 221/350  mediastinal]
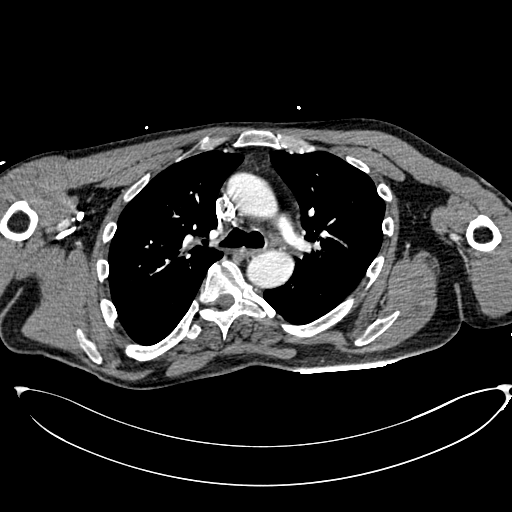
[im 239/350  lung]
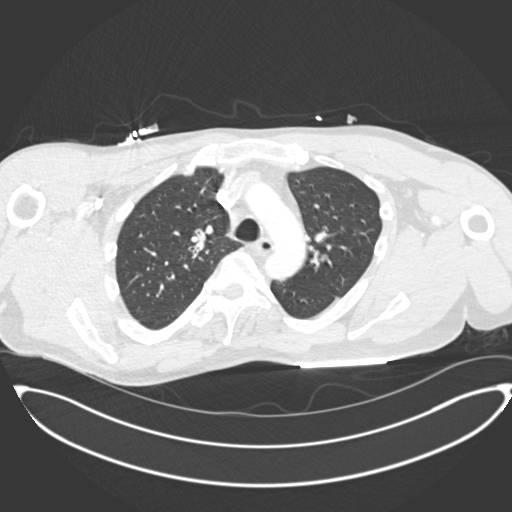
[im 258/350  mediastinal]
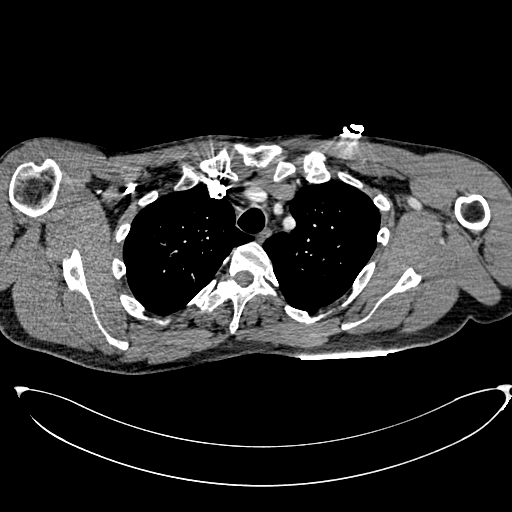
[im 276/350  lung]
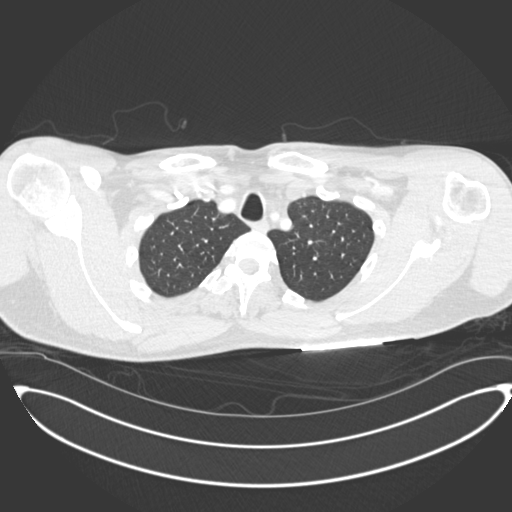
[im 294/350  mediastinal]
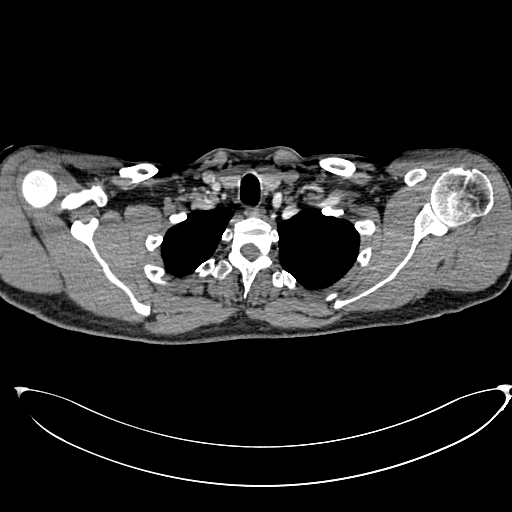
[im 313/350  lung]
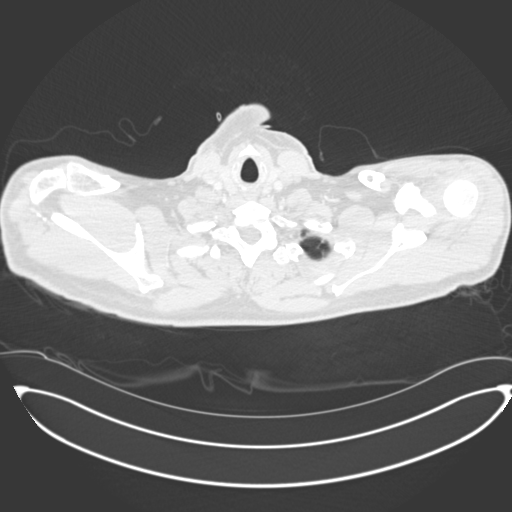
[im 331/350  mediastinal]
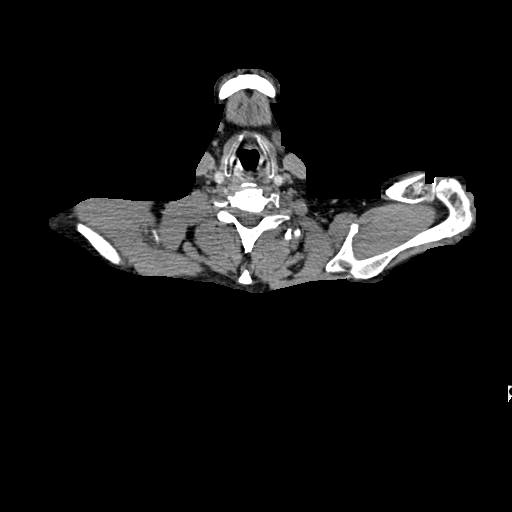

[Series 10: pe coronal mpr · coronal · 0.72mm/px · 1 of 135 slices shown]
[im 68/135  mediastinal]
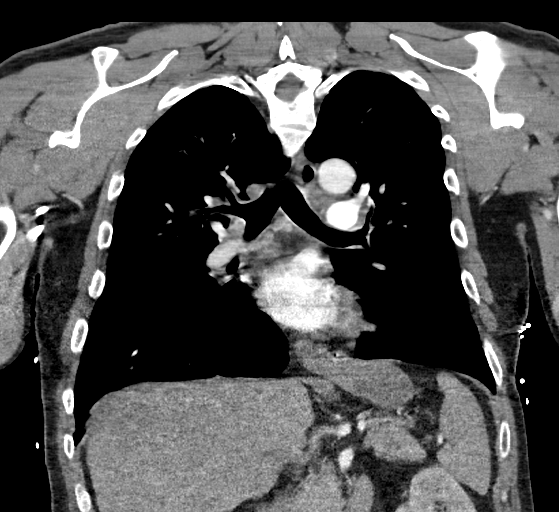

[19 of 36 positions shown; findings below may reference images not displayed]

FINDINGS: Cardiovascular: Thoracic aorta demonstrates no aneurysmal dilatation
or dissection. No significant atherosclerotic calcifications are
seen. No cardiac enlargement is seen. Mild coronary calcifications
are noted. The pulmonary artery shows a normal branching pattern.
The more peripheral branches of the lower lobes are incompletely
evaluated due to timing of the contrast bolus. No definitive filling
defect is seen.

Mediastinum/Nodes: Thoracic inlet is within normal limits. No
sizable hilar or mediastinal adenopathy is noted. The esophagus is
within normal limits.

Lungs/Pleura: The lungs are well aerated bilaterally. No focal
infiltrate or sizable effusion is seen. Some areas of mucous
plugging are noted in the left lower lobe. No sizable parenchymal
nodule is seen.

Upper Abdomen: Visualized upper abdomen is within normal limits.

Musculoskeletal: Scoliosis of the thoracic spine is seen concave to
the left. Compensatory degenerative changes are noted. The rib cage
is within normal limits.

Review of the MIP images confirms the above findings.
IMPRESSION: No definitive pulmonary emboli although the lower lobe branches are
incompletely evaluated due to timing of the contrast bolus.

Mild coronary calcifications are seen.

Mild mucous plugging in the left lower lobe. No focal infiltrate is
seen.

## 2020-12-25 DEATH — deceased
